# Patient Record
Sex: Female | Born: 1940 | Race: White | Hispanic: No
Health system: Southern US, Community
[De-identification: ages and names within clinical notes are randomized; demographics above are authoritative.]

## PROBLEM LIST (undated history)

## (undated) DIAGNOSIS — Z8719 Personal history of other diseases of the digestive system: Secondary | ICD-10-CM

## (undated) DIAGNOSIS — M1990 Unspecified osteoarthritis, unspecified site: Secondary | ICD-10-CM

## (undated) DIAGNOSIS — K219 Gastro-esophageal reflux disease without esophagitis: Secondary | ICD-10-CM

## (undated) DIAGNOSIS — Q394 Esophageal web: Secondary | ICD-10-CM

## (undated) DIAGNOSIS — R32 Unspecified urinary incontinence: Secondary | ICD-10-CM

## (undated) HISTORY — PX: TUBAL LIGATION: SHX77

## (undated) HISTORY — PX: CHOLECYSTECTOMY: SHX55

## (undated) HISTORY — PX: DILATION AND CURETTAGE OF UTERUS: SHX78

## (undated) HISTORY — PX: APPENDECTOMY: SHX54

---
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
## 1963-11-08 DIAGNOSIS — Q394 Esophageal web: Secondary | ICD-10-CM

## 1963-11-08 HISTORY — DX: Esophageal web: Q39.4

## 2000-02-01 ENCOUNTER — Encounter: Admission: RE | Admit: 2000-02-01 | Discharge: 2000-02-01 | Payer: Self-pay | Admitting: Obstetrics and Gynecology

## 2000-02-01 ENCOUNTER — Encounter: Payer: Self-pay | Admitting: Obstetrics and Gynecology

## 2001-02-13 ENCOUNTER — Encounter: Payer: Self-pay | Admitting: Obstetrics and Gynecology

## 2001-02-13 ENCOUNTER — Encounter: Admission: RE | Admit: 2001-02-13 | Discharge: 2001-02-13 | Payer: Self-pay | Admitting: Obstetrics and Gynecology

## 2002-02-18 ENCOUNTER — Encounter: Payer: Self-pay | Admitting: Obstetrics and Gynecology

## 2002-02-18 ENCOUNTER — Encounter: Admission: RE | Admit: 2002-02-18 | Discharge: 2002-02-18 | Payer: Self-pay | Admitting: Obstetrics and Gynecology

## 2003-03-06 ENCOUNTER — Encounter: Payer: Self-pay | Admitting: Obstetrics and Gynecology

## 2003-03-06 ENCOUNTER — Encounter: Admission: RE | Admit: 2003-03-06 | Discharge: 2003-03-06 | Payer: Self-pay | Admitting: Obstetrics and Gynecology

## 2004-03-08 ENCOUNTER — Encounter: Admission: RE | Admit: 2004-03-08 | Discharge: 2004-03-08 | Payer: Self-pay | Admitting: Obstetrics and Gynecology

## 2005-03-09 ENCOUNTER — Encounter: Admission: RE | Admit: 2005-03-09 | Discharge: 2005-03-09 | Payer: Self-pay | Admitting: Obstetrics and Gynecology

## 2005-03-28 ENCOUNTER — Encounter: Admission: RE | Admit: 2005-03-28 | Discharge: 2005-03-28 | Payer: Self-pay | Admitting: Obstetrics and Gynecology

## 2006-03-29 ENCOUNTER — Encounter: Admission: RE | Admit: 2006-03-29 | Discharge: 2006-03-29 | Payer: Self-pay | Admitting: Obstetrics and Gynecology

## 2007-04-03 ENCOUNTER — Encounter: Admission: RE | Admit: 2007-04-03 | Discharge: 2007-04-03 | Payer: Self-pay | Admitting: Obstetrics and Gynecology

## 2008-04-03 ENCOUNTER — Encounter: Admission: RE | Admit: 2008-04-03 | Discharge: 2008-04-03 | Payer: Self-pay | Admitting: Obstetrics and Gynecology

## 2009-04-07 ENCOUNTER — Encounter: Admission: RE | Admit: 2009-04-07 | Discharge: 2009-04-07 | Payer: Self-pay | Admitting: Obstetrics and Gynecology

## 2010-03-04 ENCOUNTER — Ambulatory Visit (HOSPITAL_COMMUNITY): Admission: RE | Admit: 2010-03-04 | Discharge: 2010-03-04 | Payer: Self-pay | Admitting: Obstetrics and Gynecology

## 2010-04-09 ENCOUNTER — Encounter: Admission: RE | Admit: 2010-04-09 | Discharge: 2010-04-09 | Payer: Self-pay | Admitting: Obstetrics and Gynecology

## 2011-03-31 ENCOUNTER — Other Ambulatory Visit: Payer: Self-pay | Admitting: Family Medicine

## 2011-03-31 DIAGNOSIS — Z1231 Encounter for screening mammogram for malignant neoplasm of breast: Secondary | ICD-10-CM

## 2011-04-12 ENCOUNTER — Ambulatory Visit
Admission: RE | Admit: 2011-04-12 | Discharge: 2011-04-12 | Disposition: A | Discharge status: Home / Self Care | Payer: Medicare Other | Source: Ambulatory Visit | Attending: Family Medicine | Admitting: Family Medicine

## 2011-04-12 DIAGNOSIS — Z1231 Encounter for screening mammogram for malignant neoplasm of breast: Secondary | ICD-10-CM

## 2011-11-08 HISTORY — PX: ANTERIOR AND POSTERIOR REPAIR: SHX1172

## 2011-11-08 HISTORY — PX: EXCISIONAL HEMORRHOIDECTOMY: SHX1541

## 2011-11-08 HISTORY — PX: OTHER SURGICAL HISTORY: SHX169

## 2012-03-09 ENCOUNTER — Other Ambulatory Visit: Payer: Self-pay | Admitting: Family Medicine

## 2012-03-09 DIAGNOSIS — Z1231 Encounter for screening mammogram for malignant neoplasm of breast: Secondary | ICD-10-CM

## 2012-04-24 ENCOUNTER — Ambulatory Visit
Admission: RE | Admit: 2012-04-24 | Discharge: 2012-04-24 | Disposition: A | Discharge status: Home / Self Care | Payer: Medicare Other | Source: Ambulatory Visit | Attending: Family Medicine | Admitting: Family Medicine

## 2012-04-24 DIAGNOSIS — Z1231 Encounter for screening mammogram for malignant neoplasm of breast: Secondary | ICD-10-CM

## 2013-03-29 ENCOUNTER — Other Ambulatory Visit (HOSPITAL_COMMUNITY): Payer: Self-pay | Admitting: General Practice

## 2013-03-29 ENCOUNTER — Other Ambulatory Visit: Payer: Self-pay | Admitting: General Practice

## 2013-03-29 DIAGNOSIS — Z1231 Encounter for screening mammogram for malignant neoplasm of breast: Secondary | ICD-10-CM

## 2013-03-29 DIAGNOSIS — M818 Other osteoporosis without current pathological fracture: Secondary | ICD-10-CM

## 2013-04-18 ENCOUNTER — Ambulatory Visit (HOSPITAL_COMMUNITY): Payer: Medicare Other

## 2013-04-18 ENCOUNTER — Other Ambulatory Visit (HOSPITAL_COMMUNITY): Payer: Medicare Other

## 2013-04-29 ENCOUNTER — Other Ambulatory Visit: Payer: Medicare Other

## 2013-04-29 ENCOUNTER — Ambulatory Visit: Payer: Medicare Other

## 2013-05-13 ENCOUNTER — Ambulatory Visit
Admission: RE | Admit: 2013-05-13 | Discharge: 2013-05-13 | Disposition: A | Discharge status: Home / Self Care | Payer: Medicare Other | Source: Ambulatory Visit | Attending: General Practice | Admitting: General Practice

## 2013-05-13 DIAGNOSIS — Z1231 Encounter for screening mammogram for malignant neoplasm of breast: Secondary | ICD-10-CM

## 2013-05-13 DIAGNOSIS — M818 Other osteoporosis without current pathological fracture: Secondary | ICD-10-CM

## 2014-04-10 ENCOUNTER — Other Ambulatory Visit: Payer: Self-pay | Admitting: Podiatry

## 2014-04-17 NOTE — Addendum Note (Signed)
Addended by: Wentworth Edelen on: 04/17/2014 10:48 AM   Modules accepted: Orders  

## 2014-04-21 ENCOUNTER — Encounter (HOSPITAL_COMMUNITY): Payer: Self-pay

## 2014-04-22 NOTE — Patient Instructions (Signed)
Mercedes MassedBenton C Schultz  04/22/2014   Your procedure is scheduled on:  05/01/14  [facility].  Call this number if you have problems the morning of surgery: [phone]   Remember:   Do not eat food or drink liquids after midnight.   Take these medicines the morning of surgery with A SIP OF WATER: Omeprazole. You may take your Cyclobenazeprine if needed.   Do not wear jewelry, make-up or nail polish.  Do not wear lotions, powders, or perfumes. You may wear deodorant.  Do not shave 48 hours prior to surgery. Men may shave face and neck.  Do not bring valuables to the hospital.  [facility] is not responsible for any belongings or valuables.               Contacts, dentures or bridgework may not be worn into surgery.  Leave suitcase in the car. After surgery it may be brought to your room.  For patients admitted to the hospital, discharge time is determined by your treatment team.               Patients discharged the day of surgery will not be allowed to drive home.   Special Instructions: Shower using CHG 1 nights before surgery and the morning of surgery.    Please read over the following fact sheets that you were given: Anesthesia Post-op Instructions    Hammer Toes Hammer toes is a condition in which one or more of your toes is permanently flexed. CAUSES  This happens when a muscle imbalance or abnormal bone length makes your small toes buckle. This causes the toe joint to contract and the strong cord-like bands that attach muscles to the bones (tendons) in your toes to shorten.  SIGNS AND SYMPTOMS  Common symptoms of flexible hammer toes include:   A build up of skin cells (Corns). Corns occur where boney bumps come in frequent contact with hard surfaces. For example, where your shoes press and rub.  Irritation.  Inflammation.  Pain.  Limited motion in your toes DIAGNOSIS  Hammer toes are diagnosed through a physical exam of your toes.During the exam,  your health care provider may try to reproduce your symptoms by manipulating your foot. Often, x-ray exams are done to determine the degree of deformity and to make sure that the cause is not a fracture.  TREATMENT  Hammer toes can be treated with corrective surgery. There are several types of surgical procedures that can treat hammer toes. The most common procedures include:  Arthroplasty A portion of the joint is surgically removed and your toe is straightened. The gap fills in with fibrous tissue. This procedure helps treat pain and deformity and helps restore function.  Fusion Cartilage between the two bones of the affected joint is taken out and the bones fuse together into one longer bone. This helps keep your toe stable and reduces pain but leaves your toe stiff, yet straight.  Implantation A portion of your bone is removed and replaced with an implant to restore motion.  Flexor tendon transfers This procedure repositions the tendons that curl the toes down (flexor tendons). This may be done to release the deforming force that causes your toe to buckle. Several of these procedures require fixing your toe with a pin that is visible at the tip of your toe. The pin keeps the toe straight during healing. Your health care provider will remove the pin usually within 4 8 weeks after the procedure.  Document Released: 10/21/2000 Document Revised: 08/14/2013 Document Reviewed: 07/01/2013 [facility] Patient Information 2014 [place] [place].    PATIENT INSTRUCTIONS POST-ANESTHESIA  IMMEDIATELY FOLLOWING SURGERY:  Do not drive or operate machinery for the first twenty four hours after surgery.  Do not make any important decisions for twenty four hours after surgery or while taking narcotic pain medications or sedatives.  If you develop intractable nausea and vomiting or a severe headache please notify your doctor immediately.  FOLLOW-UP:  Please make an appointment with your surgeon as instructed.  You do not need to follow up with anesthesia unless specifically instructed to do so.  WOUND CARE INSTRUCTIONS (if applicable):  Keep a dry clean dressing on the anesthesia/puncture wound site if there is drainage.  Once the wound has quit draining you may leave it open to air.  Generally you should leave the bandage intact for twenty four hours unless there is drainage.  If the epidural site drains for more than 36-48 hours please call the anesthesia department.  QUESTIONS?:  Please feel free to call your physician or the hospital operator if you have any questions, and they will be happy to assist you.

## 2014-04-23 ENCOUNTER — Encounter (HOSPITAL_COMMUNITY): Payer: Self-pay

## 2014-04-23 ENCOUNTER — Other Ambulatory Visit: Payer: Self-pay

## 2014-04-23 ENCOUNTER — Encounter (HOSPITAL_COMMUNITY)
Admission: RE | Admit: 2014-04-23 | Discharge: 2014-04-23 | Disposition: A | Discharge status: Home / Self Care | Payer: Medicare Other | Source: Ambulatory Visit | Attending: Podiatry | Admitting: Podiatry

## 2014-04-23 DIAGNOSIS — Z01810 Encounter for preprocedural cardiovascular examination: Secondary | ICD-10-CM | POA: Insufficient documentation

## 2014-04-23 DIAGNOSIS — Z01812 Encounter for preprocedural laboratory examination: Secondary | ICD-10-CM | POA: Diagnosis not present

## 2014-04-23 HISTORY — DX: Unspecified osteoarthritis, unspecified site: M19.90

## 2014-04-23 HISTORY — DX: Personal history of other diseases of the digestive system: Z87.19

## 2014-04-23 HISTORY — DX: Gastro-esophageal reflux disease without esophagitis: K21.9

## 2014-04-23 HISTORY — DX: Esophageal web: Q39.4

## 2014-04-23 HISTORY — DX: Unspecified urinary incontinence: R32

## 2014-04-23 LAB — BASIC METABOLIC PANEL
BUN: 21 mg/dL (ref 6–23)
CHLORIDE: 104 meq/L (ref 96–112)
CO2: 26 mEq/L (ref 19–32)
Calcium: 9.3 mg/dL (ref 8.4–10.5)
Creatinine, Ser: 0.79 mg/dL (ref 0.50–1.10)
GFR, EST NON AFRICAN AMERICAN: 82 mL/min — AB (ref >90)
Glucose, Bld: 128 mg/dL — ABNORMAL HIGH (ref 70–99)
Potassium: 4.4 mEq/L (ref 3.7–5.3)
SODIUM: 143 meq/L (ref 137–147)

## 2014-04-23 LAB — HEMOGLOBIN AND HEMATOCRIT, BLOOD
HCT: 40.3 % (ref 36.0–46.0)
Hemoglobin: 13.3 g/dL (ref 12.0–15.0)

## 2014-05-01 ENCOUNTER — Ambulatory Visit (HOSPITAL_COMMUNITY): Payer: Medicare Other | Admitting: Anesthesiology

## 2014-05-01 ENCOUNTER — Ambulatory Visit (HOSPITAL_COMMUNITY): Payer: Medicare Other

## 2014-05-01 ENCOUNTER — Ambulatory Visit (HOSPITAL_COMMUNITY)
Admission: RE | Admit: 2014-05-01 | Discharge: 2014-05-01 | Disposition: A | Discharge status: Home / Self Care | Payer: Medicare Other | Source: Ambulatory Visit | Attending: Podiatry | Admitting: Podiatry

## 2014-05-01 ENCOUNTER — Encounter (HOSPITAL_COMMUNITY): Payer: Self-pay | Admitting: *Deleted

## 2014-05-01 ENCOUNTER — Encounter (HOSPITAL_COMMUNITY): Payer: Medicare Other | Admitting: Anesthesiology

## 2014-05-01 ENCOUNTER — Encounter (HOSPITAL_COMMUNITY)
Admission: RE | Disposition: A | Discharge status: Home / Self Care | Payer: Self-pay | Source: Ambulatory Visit | Attending: Podiatry

## 2014-05-01 DIAGNOSIS — M2040 Other hammer toe(s) (acquired), unspecified foot: Secondary | ICD-10-CM | POA: Insufficient documentation

## 2014-05-01 DIAGNOSIS — S93129A Dislocation of metatarsophalangeal joint of unspecified toe(s), initial encounter: Secondary | ICD-10-CM | POA: Insufficient documentation

## 2014-05-01 DIAGNOSIS — K219 Gastro-esophageal reflux disease without esophagitis: Secondary | ICD-10-CM | POA: Insufficient documentation

## 2014-05-01 DIAGNOSIS — M2041 Other hammer toe(s) (acquired), right foot: Secondary | ICD-10-CM

## 2014-05-01 DIAGNOSIS — Z79899 Other long term (current) drug therapy: Secondary | ICD-10-CM | POA: Insufficient documentation

## 2014-05-01 HISTORY — PX: TENDON TRANSFER: SHX6109

## 2014-05-01 HISTORY — PX: HAMMER TOE SURGERY: SHX385

## 2014-05-01 HISTORY — PX: OSTECTOMY: SHX6439

## 2014-05-01 SURGERY — CORRECTION, HAMMER TOE
Anesthesia: Monitor Anesthesia Care | Site: Foot | Laterality: Right

## 2014-05-01 MED ORDER — PROPOFOL 10 MG/ML IV EMUL
INTRAVENOUS | Status: AC
  Filled 2014-05-01: qty 20

## 2014-05-01 MED ORDER — FENTANYL CITRATE 0.05 MG/ML IJ SOLN
INTRAMUSCULAR | Status: AC
  Filled 2014-05-01: qty 2

## 2014-05-01 MED ORDER — LIDOCAINE HCL (PF) 1 % IJ SOLN
INTRAMUSCULAR | Status: AC
  Filled 2014-05-01: qty 30

## 2014-05-01 MED ORDER — MIDAZOLAM HCL 2 MG/2ML IJ SOLN
INTRAMUSCULAR | Status: AC
  Filled 2014-05-01: qty 2

## 2014-05-01 MED ORDER — FENTANYL CITRATE 0.05 MG/ML IJ SOLN
25.0000 ug | INTRAMUSCULAR | Status: AC
  Administered 2014-05-01 (×2): 25 ug via INTRAVENOUS

## 2014-05-01 MED ORDER — BUPIVACAINE HCL (PF) 0.5 % IJ SOLN
INTRAMUSCULAR | Status: AC
  Filled 2014-05-01: qty 30

## 2014-05-01 MED ORDER — FENTANYL CITRATE 0.05 MG/ML IJ SOLN
INTRAMUSCULAR | Status: DC | PRN
  Administered 2014-05-01 (×2): 25 ug via INTRAVENOUS

## 2014-05-01 MED ORDER — ONDANSETRON HCL 4 MG/2ML IJ SOLN
4.0000 mg | Freq: Once | INTRAMUSCULAR | Status: AC
  Administered 2014-05-01: 4 mg via INTRAVENOUS

## 2014-05-01 MED ORDER — MIDAZOLAM HCL 2 MG/2ML IJ SOLN
1.0000 mg | INTRAMUSCULAR | Status: DC | PRN
  Administered 2014-05-01: 2 mg via INTRAVENOUS

## 2014-05-01 MED ORDER — PROPOFOL INFUSION 10 MG/ML OPTIME
INTRAVENOUS | Status: DC | PRN
  Administered 2014-05-01: 11:00:00 via INTRAVENOUS
  Administered 2014-05-01: 25 ug/kg/min via INTRAVENOUS

## 2014-05-01 MED ORDER — FENTANYL CITRATE 0.05 MG/ML IJ SOLN: 25.0000 ug | INTRAMUSCULAR | Status: DC | PRN

## 2014-05-01 MED ORDER — ONDANSETRON HCL 4 MG/2ML IJ SOLN
INTRAMUSCULAR | Status: AC
  Filled 2014-05-01: qty 2

## 2014-05-01 MED ORDER — LACTATED RINGERS IV SOLN
INTRAVENOUS | Status: DC
  Administered 2014-05-01: 09:00:00 via INTRAVENOUS

## 2014-05-01 MED ORDER — BUPIVACAINE HCL (PF) 0.5 % IJ SOLN
INTRAMUSCULAR | Status: DC | PRN
  Administered 2014-05-01: 20 mL

## 2014-05-01 MED ORDER — MIDAZOLAM HCL 2 MG/2ML IJ SOLN
INTRAMUSCULAR | Status: AC
Start: 2014-05-01 — End: 2014-05-01
  Filled 2014-05-01: qty 2

## 2014-05-01 MED ORDER — CEFAZOLIN SODIUM-DEXTROSE 2-3 GM-% IV SOLR
2.0000 g | INTRAVENOUS | Status: AC
  Administered 2014-05-01: 2 g via INTRAVENOUS
  Filled 2014-05-01: qty 50

## 2014-05-01 MED ORDER — SODIUM CHLORIDE 0.9 % IR SOLN
Status: DC | PRN
  Administered 2014-05-01: 1000 mL

## 2014-05-01 MED ORDER — ONDANSETRON HCL 4 MG/2ML IJ SOLN: 4.0000 mg | Freq: Once | INTRAMUSCULAR | Status: DC | PRN

## 2014-05-01 MED ORDER — MIDAZOLAM HCL 5 MG/5ML IJ SOLN
INTRAMUSCULAR | Status: DC | PRN
  Administered 2014-05-01: 2 mg via INTRAVENOUS

## 2014-05-01 SURGICAL SUPPLY — 69 items
APL SKNCLS STERI-STRIP NONHPOA (GAUZE/BANDAGES/DRESSINGS) ×1
BAG HAMPER (MISCELLANEOUS) ×3 IMPLANT
BANDAGE ELASTIC 4 VELCRO NS (GAUZE/BANDAGES/DRESSINGS) ×3 IMPLANT
BANDAGE ESMARK 4X12 BL STRL LF (DISPOSABLE) ×1 IMPLANT
BANDAGE GAUZE ELAST BULKY 4 IN (GAUZE/BANDAGES/DRESSINGS) ×2 IMPLANT
BENZOIN TINCTURE PRP APPL 2/3 (GAUZE/BANDAGES/DRESSINGS) ×3 IMPLANT
BLADE 15 SAFETY STRL DISP (BLADE) ×4 IMPLANT
BLADE AVERAGE 25MMX9MM (BLADE)
BLADE AVERAGE 25X9 (BLADE) ×1 IMPLANT
BLADE OSC/SAG 11.5X5.5X.38 (BLADE) ×4 IMPLANT
BLADE OSC/SAG 18.5X9 THN (BLADE) ×3 IMPLANT
BLADE OSC/SAGITTAL MD 5.5X18 (BLADE) ×1 IMPLANT
BNDG CMPR 12X4 ELC STRL LF (DISPOSABLE) ×1
BNDG CONFORM 2 STRL LF (GAUZE/BANDAGES/DRESSINGS) ×1 IMPLANT
BNDG ESMARK 4X12 BLUE STRL LF (DISPOSABLE) ×3
BNDG GAUZE ELAST 4 BULKY (GAUZE/BANDAGES/DRESSINGS) ×1 IMPLANT
CHLORAPREP W/TINT 26ML (MISCELLANEOUS) ×3 IMPLANT
CLOSURE WOUND 1/2 X4 (GAUZE/BANDAGES/DRESSINGS) ×1
CLOTH BEACON ORANGE TIMEOUT ST (SAFETY) ×3 IMPLANT
COVER LIGHT HANDLE STERIS (MISCELLANEOUS) ×6 IMPLANT
CUFF TOURNIQUET SINGLE 18IN (TOURNIQUET CUFF) ×3 IMPLANT
DECANTER SPIKE VIAL GLASS SM (MISCELLANEOUS) ×3 IMPLANT
DIGIFUSE TOE 2.0MM 10DEG SHORT (Toe) ×2 IMPLANT
DRAPE OEC MINIVIEW 54X84 (DRAPES) ×3 IMPLANT
DRSG ADAPTIC 3X8 NADH LF (GAUZE/BANDAGES/DRESSINGS) ×3 IMPLANT
DURA STEPPER LG (CAST SUPPLIES) IMPLANT
DURA STEPPER MED (CAST SUPPLIES) IMPLANT
DURA STEPPER SML (CAST SUPPLIES) IMPLANT
DURA STEPPER XL (SOFTGOODS) IMPLANT
ELECT REM PT RETURN 9FT ADLT (ELECTROSURGICAL) ×3
ELECTRODE REM PT RTRN 9FT ADLT (ELECTROSURGICAL) ×1 IMPLANT
GAUZE SPONGE 4X4 12PLY STRL (GAUZE/BANDAGES/DRESSINGS) ×2 IMPLANT
GLOVE BIO SURGEON STRL SZ7.5 (GLOVE) ×3 IMPLANT
GLOVE BIOGEL PI IND STRL 7.0 (GLOVE) IMPLANT
GLOVE BIOGEL PI IND STRL 8.5 (GLOVE) IMPLANT
GLOVE BIOGEL PI INDICATOR 7.0 (GLOVE) ×4
GLOVE BIOGEL PI INDICATOR 8.5 (GLOVE) ×2
GLOVE ECLIPSE 7.0 STRL STRAW (GLOVE) ×2 IMPLANT
GLOVE ECLIPSE 8.5 STRL (GLOVE) ×2 IMPLANT
GLOVE SS BIOGEL STRL SZ 6.5 (GLOVE) IMPLANT
GLOVE SUPERSENSE BIOGEL SZ 6.5 (GLOVE) ×2
GOWN STRL REUS W/TWL LRG LVL3 (GOWN DISPOSABLE) ×9 IMPLANT
GUIDEWIRE DIGIFUSE .80X70MM (WIRE) ×2 IMPLANT
K-WIRE 0.8X100 (WIRE) ×2 IMPLANT
K-WIRE 6 (WIRE)
KIT ROOM TURNOVER AP CYSTO (KITS) ×3 IMPLANT
KIT ROOM TURNOVER APOR (KITS) ×3 IMPLANT
KWIRE 6 (WIRE) IMPLANT
MANIFOLD NEPTUNE II (INSTRUMENTS) ×3 IMPLANT
NDL HYPO 27GX1-1/4 (NEEDLE) ×3 IMPLANT
NEEDLE HYPO 27GX1-1/4 (NEEDLE) ×6 IMPLANT
NS IRRIG 1000ML POUR BTL (IV SOLUTION) ×3 IMPLANT
PACK BASIC LIMB (CUSTOM PROCEDURE TRAY) ×3 IMPLANT
PAD ARMBOARD 7.5X6 YLW CONV (MISCELLANEOUS) ×3 IMPLANT
PIN CAPS ORTHO GREEN .062 (PIN) IMPLANT
RASP SM TEAR CROSS CUT (RASP) ×2 IMPLANT
SCREW CANNULATED 2.0X11MM (Screw) ×4 IMPLANT
SCREW CANNULATED 3.0X12MM (Screw) ×2 IMPLANT
SET BASIN LINEN APH (SET/KITS/TRAYS/PACK) ×3 IMPLANT
SPONGE GAUZE 4X4 12PLY (GAUZE/BANDAGES/DRESSINGS) ×2 IMPLANT
SPONGE LAP 18X18 X RAY DECT (DISPOSABLE) ×3 IMPLANT
STRIP CLOSURE SKIN 1/2X4 (GAUZE/BANDAGES/DRESSINGS) ×3 IMPLANT
SUT ETHIBOND CT1 BRD 2-0 30IN (SUTURE) ×2 IMPLANT
SUT PROLENE 4 0 PS 2 18 (SUTURE) ×1 IMPLANT
SUT VIC AB 2-0 CT2 27 (SUTURE) ×1 IMPLANT
SUT VIC AB 4-0 PS2 27 (SUTURE) ×3 IMPLANT
SUT VICRYL AB 3-0 FS1 BRD 27IN (SUTURE) ×1 IMPLANT
SYR CONTROL 10ML LL (SYRINGE) ×7 IMPLANT
TOWEL OR 17X26 4PK STRL BLUE (TOWEL DISPOSABLE) ×3 IMPLANT

## 2014-05-01 NOTE — Anesthesia Preprocedure Evaluation (Signed)
Anesthesia Evaluation  Patient identified by MRN, date of birth, ID band Patient awake    Reviewed: Allergy & Precautions, H&P , NPO status , Patient's Chart, lab work & pertinent test results  Airway Mallampati: I TM Distance: >3 FB     Dental  (+) Teeth Intact   Pulmonary neg pulmonary ROS,  breath sounds clear to auscultation        Cardiovascular negative cardio ROS  Rhythm:Regular Rate:Normal     Neuro/Psych    GI/Hepatic hiatal hernia, GERD-  Medicated and Controlled,  Endo/Other    Renal/GU      Musculoskeletal   Abdominal   Peds  Hematology   Anesthesia Other Findings   Reproductive/Obstetrics                           Anesthesia Physical Anesthesia Plan  ASA: II  Anesthesia Plan: MAC   Post-op Pain Management:    Induction: Intravenous  Airway Management Planned: Nasal Cannula  Additional Equipment:   Intra-op Plan:   Post-operative Plan:   Informed Consent: I have reviewed the patients History and Physical, chart, labs and discussed the procedure including the risks, benefits and alternatives for the proposed anesthesia with the patient or authorized representative who has indicated his/her understanding and acceptance.     Plan Discussed with:   Anesthesia Plan Comments:         Anesthesia Quick Evaluation

## 2014-05-01 NOTE — Op Note (Signed)
OPERATIVE NOTE  DATE OF PROCEDURE:  05/01/2014  SURGEON:   Dallas SchimkeBenjamin Ivan Rossie Bretado, DPM  OR STAFF:   Circulator: Horton MarshallKatie S Woods, RN Relief Circulator: Cyndie Chimeanya Jarrell Smith, RN Scrub Person: Hurshel PartyAngela Maria Witt, CST Circulator Assistant: Cyndie Chimeanya Jarrell Smith, RN RN First Assistant: Eliane Decreeatherine Jann Page, RN   PREOPERATIVE DIAGNOSIS:   1.  Dislocated 2nd metatarsophalangeal joint (closed), right foot (ICD-9 838.05) 2.  Hammertoe deformity 2nd digit, right foot (ICD-9 735.4) 3.  Pain, right foot (ICD-9 729.5)  POSTOPERATIVE DIAGNOSIS: Same  PROCEDURE: 1.  Weil osteotomy of the 2nd metatarsal, right foot (CPT [phone]) 2.  Hammertoe repair 2nd digit, right foot (CPT [id number])  ANESTHESIA:  Monitor Anesthesia Care   HEMOSTASIS:   Pneumatic ankle tourniquet set at 250 mmHg  ESTIMATED BLOOD LOSS:   Minimal (<5 cc)  MATERIALS USED:  1.  Stryker screw 3.0 mm 2.  AccuMed toe implant  INJECTABLES: Marcaine 0.5% plain; 20mL  PATHOLOGY:   None  COMPLICATIONS:   None  INDICATIONS:   Painful, chronic hammertoe deformity of the second digit of the right foot with dislocation of the second metatarsophalangeal joint  DESCRIPTION OF THE PROCEDURE:   The patient was brought to the operating room and placed on the operative table in the supine position.  A pneumatic ankle tourniquet was applied to the patient's ankle.  Following sedation, the surgical site was anesthetized with 0.5% Marcaine plain.  The foot was then prepped, scrubbed, and draped in the usual sterile technique.  The foot was elevated, exsanguinated and the pneumatic ankle tourniquet inflated to 250 mmHg.    Attention was directed to the dorsal aspect of the right foot.  A linear longitudinal incision was made extending from the middle phalanx proximally over the second metatarsophalangeal joint.  Dissection was continued deep down to the level of the extensor digitorum longus tendon.  The extensor hood was released.  A Z shaped  tenotomy was performed to allow for appropriate tendon balancing.  The extensor tendon was reflected proximally and distally thus exposing the proximal interphalangeal joint and metatarsophalangeal joint.    A transverse capsulotomy of the second metatarsophalangeal joint was performed.  The proximal phalanx was found to be dorsally dislocated upon the second metatarsal head consistent with preoperative radiographic findings.  A McGlamry elevator was used to release soft tissue contractures at the second metatarsophalangeal joint.  A Weil osteotomy was performed to the second metatarsal bone.  The capital fragment was translated proximally.  A 2.0 mm cannulated Stryker screw was placed across the osteotomy site.  Adequate fixation was not achieved.  The screw was removed.  A longer screw was placed across the osteotomy site.  Adequate fixation was not achieved.  It was determined that a larger screw would be required for adequate fixation.  A 3.0 mm cannulated Stryker screw was placed across the osteotomy site.  Fixation was achieved.  The second metatarsophalangeal joint reduced.  Attention was directed to the proximal interphalangeal joint.  A transverse capsulotomy was performed.  The articular surface from the head of the proximal phalanx and base of the middle phalanx were resected.  The flexor digitorum longus tendon was identified and transected.  The tendon was split and the medial portion of the tendon passed medial to the shaft of the proximal phalanx.  The lateral portion of the tendon was passed lateral to the shaft of the proximal phalanx.  The portions of tendon were transferred to the dorsal aspect of the shaft of the proximal phalanx  and reapproximated with 2-0 Ethibond.  An Acumed toe implant was inserted across the proximal interphalangeal joint.  Position was confirmed with C-arm fluoroscopy and found to be in good alignment.  Alignment of the toe evaluated and found to be in good alignment.   The second metatarsophalangeal joint was found to be reduced.  A dressing was applied to the operative foot.  The pneumatic ankle tourniquet was deflated and a prompt hyperemic response was noted to all digits of the right foot.   The patient tolerated the procedure well.  The patient was then transferred to PACU with vital signs stable and vascular status intact to all toes of the operative foot.  Following a period of postoperative monitoring, the patient will be discharged home.

## 2014-05-01 NOTE — H&P (Signed)
HISTORY AND PHYSICAL INTERVAL NOTE:  05/01/2014  9:11 AM  Mercedes Schultz  has presented today for surgery, with the diagnosis of hammer toe deformity 2nd toe right foot, dislocated MPJ 2nd toe right foot and pain.  The various methods of treatment have been discussed with the patient.  No guarantees were given.  After consideration of risks, benefits and other options for treatment, the patient has consented to surgery.  I have reviewed the patients' chart and labs.    Patient Vitals for the past 24 hrs:  BP Temp Temp src Pulse Resp SpO2  05/01/14 0753 115/74 mmHg 98 F (36.7 C) Oral 68 18 98 %    A history and physical examination was performed in my office.  The patient was reexamined.  There have been no changes to this history and physical examination.  Mercedes Schultz, DPM

## 2014-05-01 NOTE — Addendum Note (Signed)
Addendum created 05/01/14 1428 by Franco Noneseresa S Yates, CRNA   Modules edited: Charges VN

## 2014-05-01 NOTE — Transfer of Care (Signed)
Immediate Anesthesia Transfer of Care Note  Patient: [name]  Procedure(s) Performed: Procedure(s): HAMMER TOE REPAIR 2ND TOE RIGHT FOOT (Right) OSTECTOMY 2ND METATARSAL RIGHT FOOT (Right) POSSIBLE TENDON TRANSFER 2ND TOE RIGHT FOOT (Right)  Patient Location: PACU  Anesthesia Type:MAC  Level of Consciousness: awake, alert , oriented and patient cooperative  Airway & Oxygen Therapy: Patient Spontanous Breathing  Post-op Assessment: Report given to PACU RN, Post -op Vital signs reviewed and stable and Patient moving all extremities  Post vital signs: Reviewed and stable  Complications: No apparent anesthesia complications

## 2014-05-01 NOTE — Anesthesia Postprocedure Evaluation (Signed)
  Anesthesia Post-op Note  Patient: Mercedes Schultz  Procedure(s) Performed: Procedure(s): HAMMER TOE REPAIR 2ND TOE RIGHT FOOT (Right) OSTECTOMY 2ND METATARSAL RIGHT FOOT (Right) POSSIBLE TENDON TRANSFER 2ND TOE RIGHT FOOT (Right)  Patient Location: PACU  Anesthesia Type:MAC  Level of Consciousness: awake, alert , oriented and patient cooperative  Airway and Oxygen Therapy: Patient Spontanous Breathing  Post-op Pain: 3 /10, mild  Post-op Assessment: Post-op Vital signs reviewed, Patient's Cardiovascular Status Stable, Respiratory Function Stable and NAUSEA AND VOMITING PRESENT  Post-op Vital Signs: Reviewed and stable  Last Vitals:  Filed Vitals:   05/01/14 0930  BP: 111/71  Pulse:   Temp:   Resp: 13    Complications: No apparent anesthesia complications

## 2014-05-05 ENCOUNTER — Encounter (HOSPITAL_COMMUNITY): Payer: Self-pay | Admitting: Podiatry

## 2014-05-06 ENCOUNTER — Encounter (HOSPITAL_COMMUNITY): Payer: Self-pay | Admitting: Podiatry

## 2014-06-09 ENCOUNTER — Other Ambulatory Visit: Payer: Self-pay

## 2014-06-09 DIAGNOSIS — Z1231 Encounter for screening mammogram for malignant neoplasm of breast: Secondary | ICD-10-CM

## 2014-07-22 ENCOUNTER — Ambulatory Visit: Payer: Medicare Other

## 2014-08-04 ENCOUNTER — Ambulatory Visit
Admission: RE | Admit: 2014-08-04 | Discharge: 2014-08-04 | Disposition: A | Discharge status: Home / Self Care | Payer: Medicare Other | Source: Ambulatory Visit

## 2014-08-04 DIAGNOSIS — Z1231 Encounter for screening mammogram for malignant neoplasm of breast: Secondary | ICD-10-CM

## 2015-08-03 ENCOUNTER — Other Ambulatory Visit: Payer: Self-pay

## 2015-08-03 DIAGNOSIS — Z1231 Encounter for screening mammogram for malignant neoplasm of breast: Secondary | ICD-10-CM

## 2015-08-10 ENCOUNTER — Ambulatory Visit
Admission: RE | Admit: 2015-08-10 | Discharge: 2015-08-10 | Disposition: A | Discharge status: Home / Self Care | Payer: Medicare HMO | Source: Ambulatory Visit

## 2015-08-10 DIAGNOSIS — Z1231 Encounter for screening mammogram for malignant neoplasm of breast: Secondary | ICD-10-CM

## 2016-07-25 ENCOUNTER — Other Ambulatory Visit: Payer: Self-pay | Admitting: Family

## 2016-07-25 DIAGNOSIS — Z1231 Encounter for screening mammogram for malignant neoplasm of breast: Secondary | ICD-10-CM

## 2016-08-10 ENCOUNTER — Ambulatory Visit
Admission: RE | Admit: 2016-08-10 | Discharge: 2016-08-10 | Disposition: A | Discharge status: Home / Self Care | Payer: Medicare HMO | Source: Ambulatory Visit | Attending: Family | Admitting: Family

## 2016-08-10 DIAGNOSIS — Z1231 Encounter for screening mammogram for malignant neoplasm of breast: Secondary | ICD-10-CM

## 2017-08-03 ENCOUNTER — Other Ambulatory Visit: Payer: Self-pay | Admitting: Family

## 2017-08-03 DIAGNOSIS — R5381 Other malaise: Secondary | ICD-10-CM

## 2017-08-04 ENCOUNTER — Other Ambulatory Visit: Payer: Self-pay | Admitting: Family

## 2017-08-04 DIAGNOSIS — M8580 Other specified disorders of bone density and structure, unspecified site: Secondary | ICD-10-CM

## 2017-08-04 DIAGNOSIS — Z1231 Encounter for screening mammogram for malignant neoplasm of breast: Secondary | ICD-10-CM

## 2017-08-28 ENCOUNTER — Ambulatory Visit
Admission: RE | Admit: 2017-08-28 | Discharge: 2017-08-28 | Disposition: A | Discharge status: Home / Self Care | Payer: Medicare HMO | Source: Ambulatory Visit | Attending: Family | Admitting: Family

## 2017-08-28 DIAGNOSIS — Z1231 Encounter for screening mammogram for malignant neoplasm of breast: Secondary | ICD-10-CM

## 2017-08-28 DIAGNOSIS — M8580 Other specified disorders of bone density and structure, unspecified site: Secondary | ICD-10-CM

## 2018-08-07 ENCOUNTER — Other Ambulatory Visit: Payer: Self-pay | Admitting: Family

## 2018-08-07 DIAGNOSIS — Z1231 Encounter for screening mammogram for malignant neoplasm of breast: Secondary | ICD-10-CM

## 2018-09-11 ENCOUNTER — Ambulatory Visit
Admission: RE | Admit: 2018-09-11 | Discharge: 2018-09-11 | Disposition: A | Discharge status: Home / Self Care | Payer: Medicare Other | Source: Ambulatory Visit | Attending: Family | Admitting: Family

## 2018-09-11 DIAGNOSIS — Z1231 Encounter for screening mammogram for malignant neoplasm of breast: Secondary | ICD-10-CM

## 2019-08-12 ENCOUNTER — Other Ambulatory Visit: Payer: Self-pay | Admitting: Family

## 2019-08-12 DIAGNOSIS — Z1231 Encounter for screening mammogram for malignant neoplasm of breast: Secondary | ICD-10-CM

## 2019-09-26 ENCOUNTER — Ambulatory Visit
Admission: RE | Admit: 2019-09-26 | Discharge: 2019-09-26 | Disposition: A | Discharge status: Home / Self Care | Payer: Medicare Other | Source: Ambulatory Visit | Attending: Family | Admitting: Family

## 2019-09-26 ENCOUNTER — Other Ambulatory Visit: Payer: Self-pay

## 2019-09-26 DIAGNOSIS — Z1231 Encounter for screening mammogram for malignant neoplasm of breast: Secondary | ICD-10-CM

## 2020-06-26 IMAGING — MG DIGITAL SCREENING BILAT W/ TOMO W/ CAD
8 series · 9 of 24 positions shown · non-contrast
Comparison: Previous exam(s).

CLINICAL DATA: Screening.

EXAM:
DIGITAL SCREENING BILATERAL MAMMOGRAM WITH TOMO AND CAD

[L MLO synth-2D]
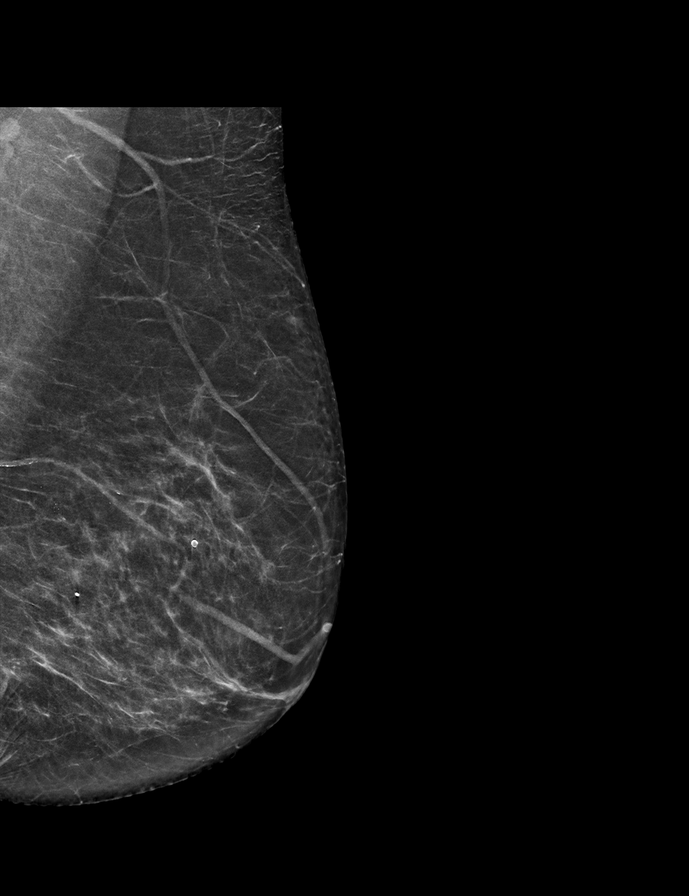

[R MLO synth-2D]
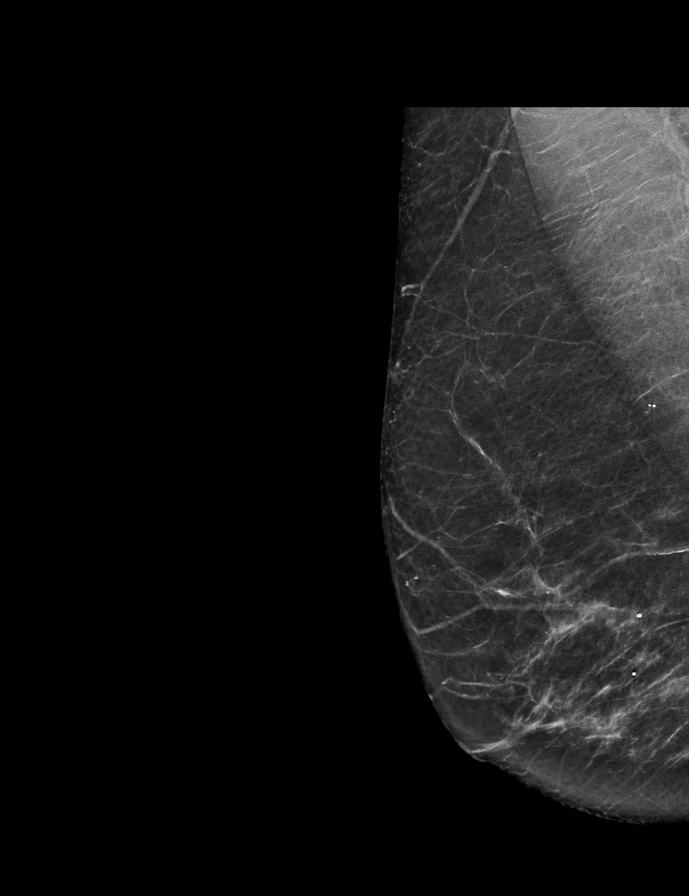

[R CC synth-2D]
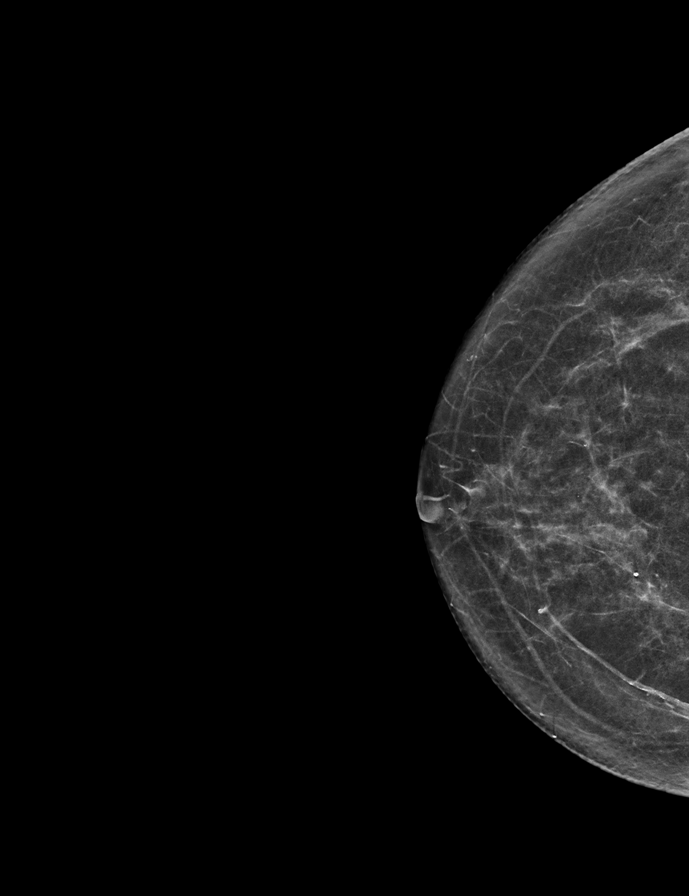

[L CC synth-2D]
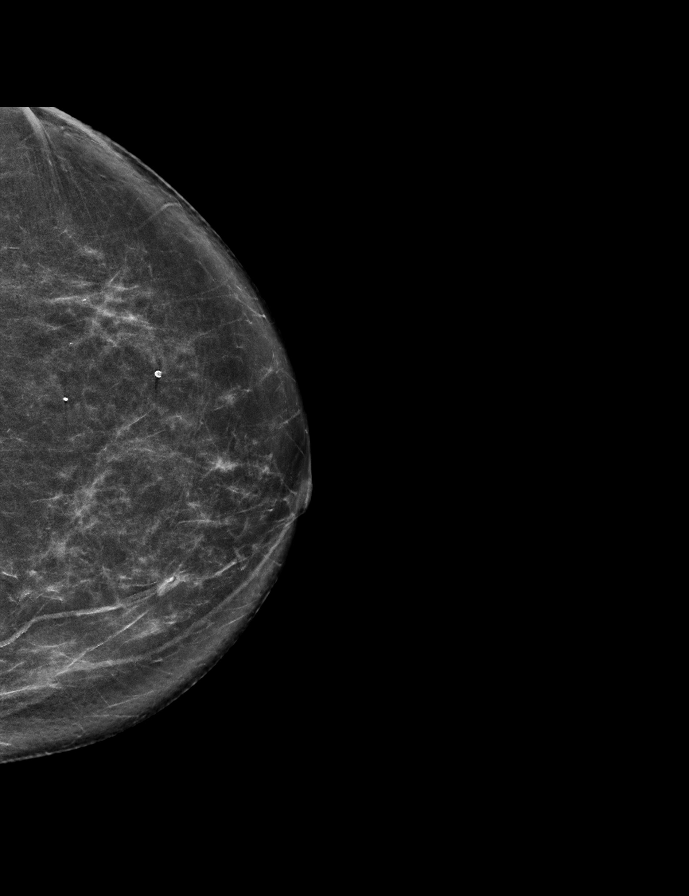

[L CC tomo · 2 of 64 frames shown]
[frame 21/64]
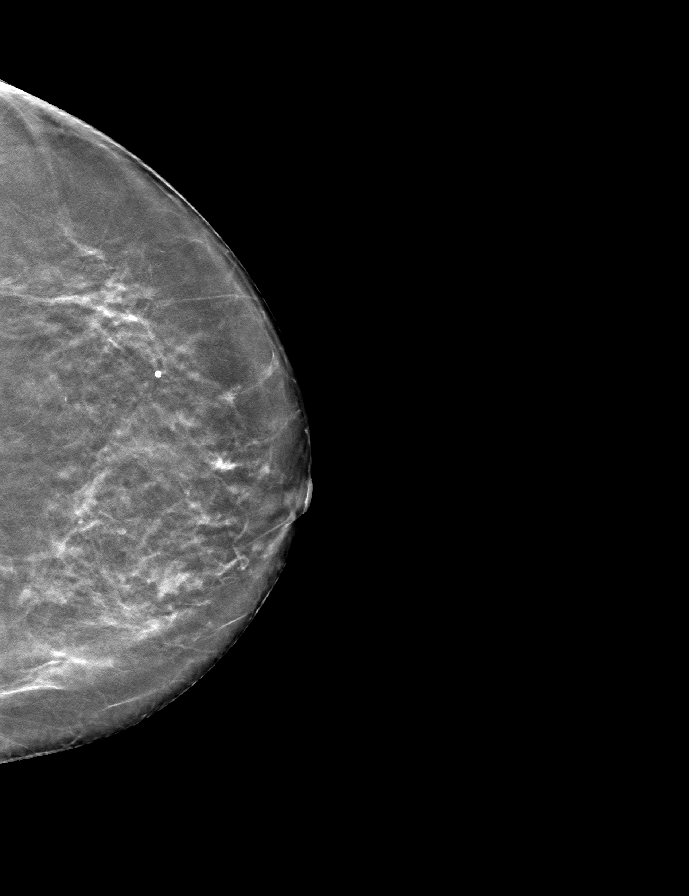
[frame 33/64]
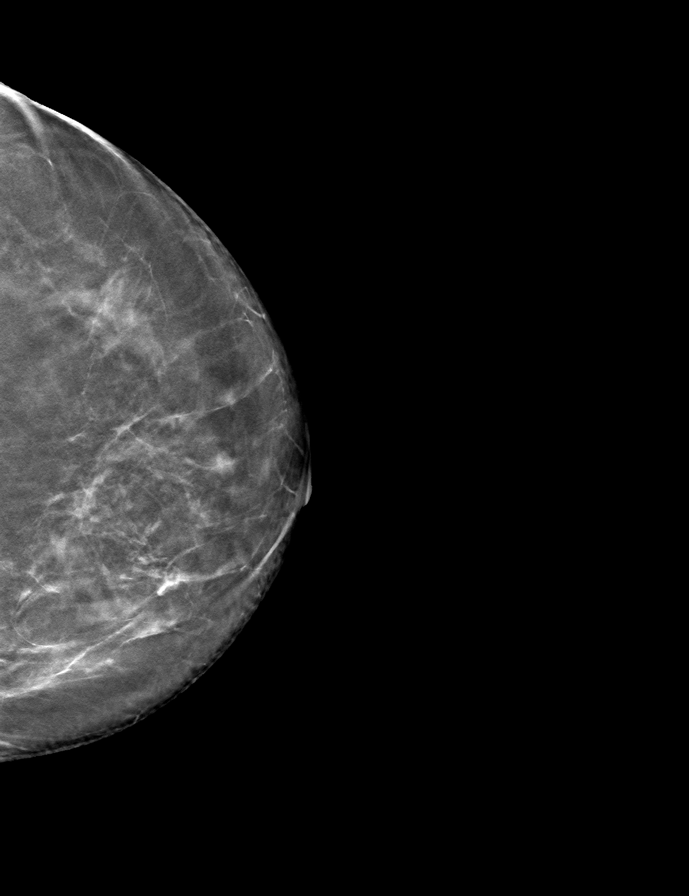

[R MLO tomo · tomo slice 30/59.0]
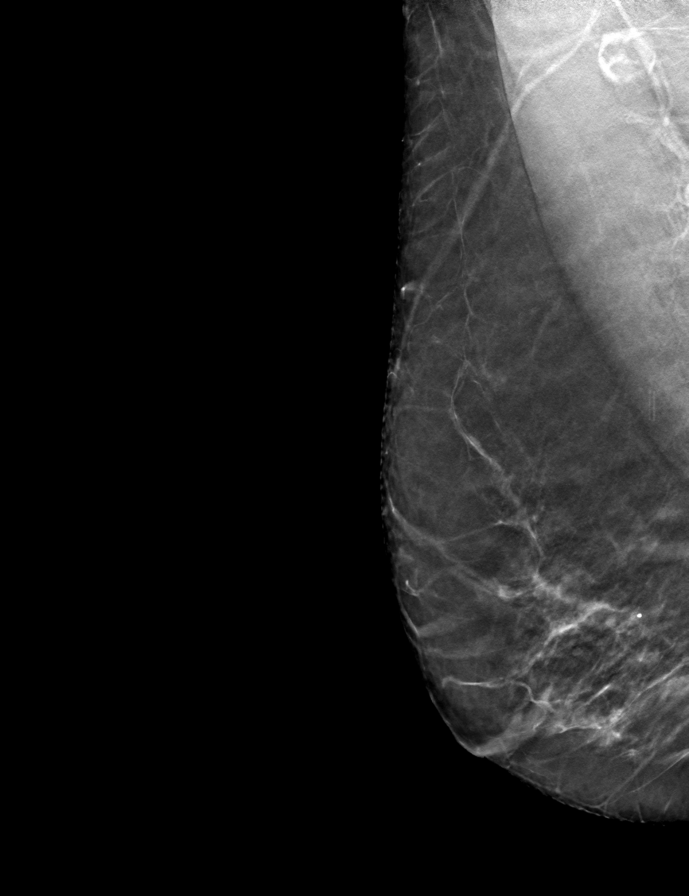

[L MLO tomo · tomo slice 31/60.0]
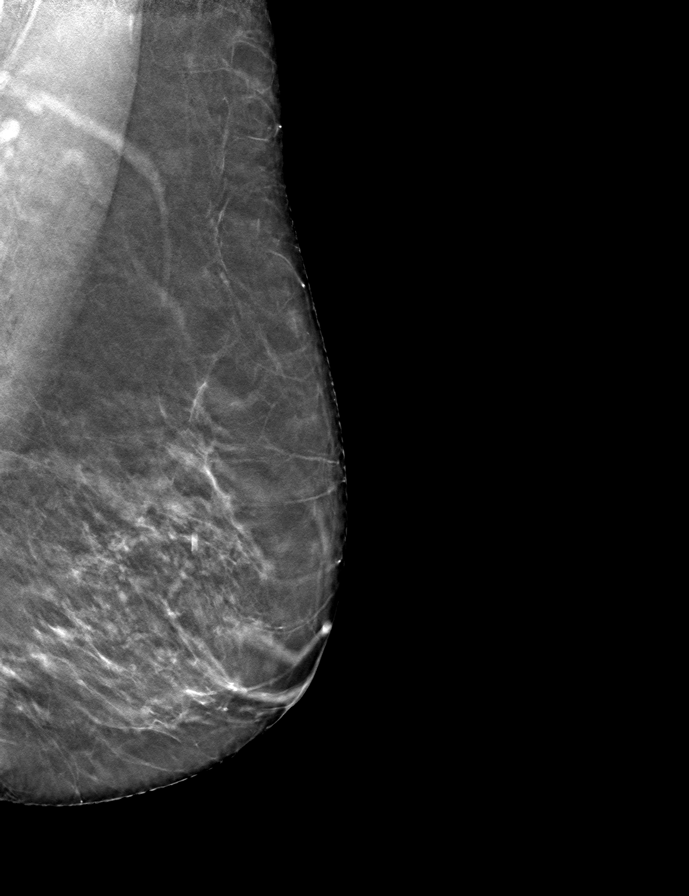

[R CC tomo · tomo slice 29/58.0]
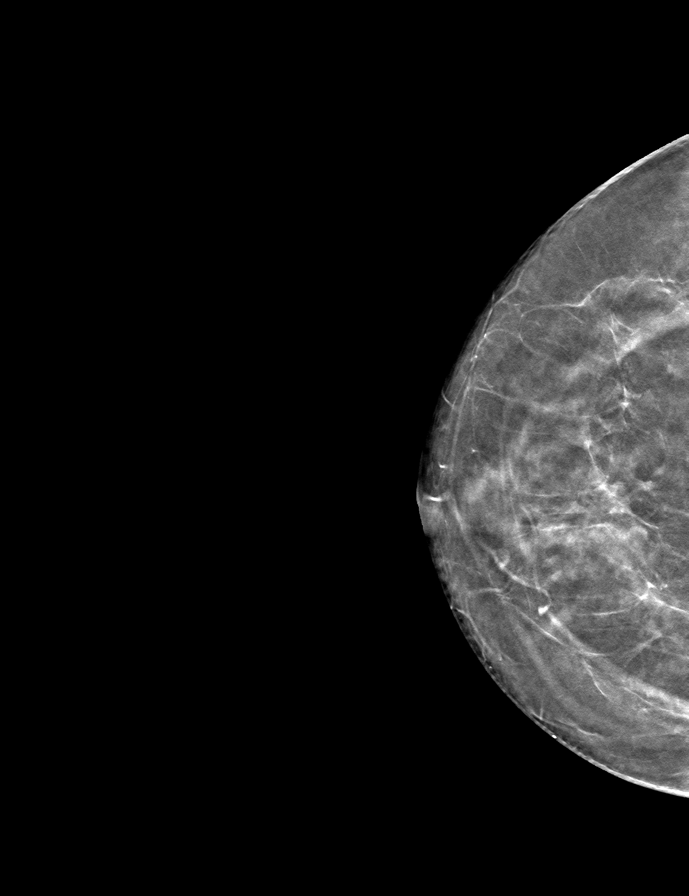

[9 of 24 positions shown; findings below may reference images not displayed]

ACR Breast Density Category b: There are scattered areas of
fibroglandular density.
FINDINGS: There are no findings suspicious for malignancy. Images were
processed with CAD.
IMPRESSION: No mammographic evidence of malignancy. A result letter of this
screening mammogram will be mailed directly to the patient.

RECOMMENDATION:
Screening mammogram in one year. (Code:[id number])

BI-RADS CATEGORY  1: Negative.

## 2020-09-07 ENCOUNTER — Other Ambulatory Visit: Payer: Self-pay | Admitting: Family

## 2020-09-07 DIAGNOSIS — Z780 Asymptomatic menopausal state: Secondary | ICD-10-CM

## 2020-09-07 DIAGNOSIS — Z1231 Encounter for screening mammogram for malignant neoplasm of breast: Secondary | ICD-10-CM

## 2020-12-18 ENCOUNTER — Ambulatory Visit
Admission: RE | Admit: 2020-12-18 | Discharge: 2020-12-18 | Disposition: A | Discharge status: Home / Self Care | Payer: Medicare Other | Source: Ambulatory Visit | Attending: Family | Admitting: Family

## 2020-12-18 ENCOUNTER — Other Ambulatory Visit: Payer: Self-pay

## 2020-12-18 DIAGNOSIS — Z780 Asymptomatic menopausal state: Secondary | ICD-10-CM

## 2020-12-18 DIAGNOSIS — Z1231 Encounter for screening mammogram for malignant neoplasm of breast: Secondary | ICD-10-CM

## 2021-11-11 ENCOUNTER — Other Ambulatory Visit: Payer: Self-pay | Admitting: Emergency Medicine

## 2021-11-11 DIAGNOSIS — Z1231 Encounter for screening mammogram for malignant neoplasm of breast: Secondary | ICD-10-CM

## 2021-12-20 ENCOUNTER — Ambulatory Visit
Admission: RE | Admit: 2021-12-20 | Discharge: 2021-12-20 | Disposition: A | Discharge status: Home / Self Care | Payer: Medicare Other | Source: Ambulatory Visit | Attending: Emergency Medicine | Admitting: Emergency Medicine

## 2021-12-20 DIAGNOSIS — Z1231 Encounter for screening mammogram for malignant neoplasm of breast: Secondary | ICD-10-CM

## 2022-11-18 ENCOUNTER — Encounter: Payer: Self-pay | Admitting: Acute Care

## 2022-11-18 ENCOUNTER — Other Ambulatory Visit: Payer: Self-pay | Admitting: Emergency Medicine

## 2022-11-18 DIAGNOSIS — Z1231 Encounter for screening mammogram for malignant neoplasm of breast: Secondary | ICD-10-CM

## 2022-12-26 ENCOUNTER — Ambulatory Visit
Admission: RE | Admit: 2022-12-26 | Discharge: 2022-12-26 | Disposition: A | Discharge status: Home / Self Care | Payer: Medicare Other | Source: Ambulatory Visit | Attending: Emergency Medicine | Admitting: Emergency Medicine

## 2022-12-26 DIAGNOSIS — Z1231 Encounter for screening mammogram for malignant neoplasm of breast: Secondary | ICD-10-CM

## 2023-11-20 ENCOUNTER — Other Ambulatory Visit: Payer: Self-pay | Admitting: Emergency Medicine

## 2023-11-20 DIAGNOSIS — Z1231 Encounter for screening mammogram for malignant neoplasm of breast: Secondary | ICD-10-CM

## 2023-12-28 ENCOUNTER — Ambulatory Visit: Payer: Medicare Other

## 2024-01-18 ENCOUNTER — Ambulatory Visit
Admission: RE | Admit: 2024-01-18 | Discharge: 2024-01-18 | Disposition: A | Discharge status: Home / Self Care | Payer: Medicare Other | Source: Ambulatory Visit | Attending: Emergency Medicine | Admitting: Emergency Medicine

## 2024-01-18 DIAGNOSIS — Z1231 Encounter for screening mammogram for malignant neoplasm of breast: Secondary | ICD-10-CM

## 2024-11-06 ENCOUNTER — Other Ambulatory Visit: Payer: Self-pay | Admitting: Podiatry

## 2024-11-13 ENCOUNTER — Encounter (HOSPITAL_COMMUNITY)
Admission: RE | Admit: 2024-11-13 | Discharge: 2024-11-13 | Disposition: A | Discharge status: Home / Self Care | Source: Ambulatory Visit | Attending: Podiatry | Admitting: Podiatry

## 2024-11-13 ENCOUNTER — Encounter (HOSPITAL_COMMUNITY): Payer: Self-pay

## 2024-11-13 ENCOUNTER — Other Ambulatory Visit: Payer: Self-pay

## 2024-11-13 DIAGNOSIS — Z01818 Encounter for other preprocedural examination: Secondary | ICD-10-CM | POA: Diagnosis present

## 2024-11-13 DIAGNOSIS — S92335K Nondisplaced fracture of third metatarsal bone, left foot, subsequent encounter for fracture with nonunion: Secondary | ICD-10-CM | POA: Diagnosis not present

## 2024-11-13 DIAGNOSIS — X58XXXD Exposure to other specified factors, subsequent encounter: Secondary | ICD-10-CM | POA: Insufficient documentation

## 2024-11-13 NOTE — Patient Instructions (Signed)
 "      Mercedes Schultz  11/13/2024     @PREFPERIOPPHARMACY @   Your procedure is scheduled on 11/15/2024.   [facility] at  0745 A.M.   Call this number if you have problems the morning of surgery:  [phone]  If you experience any cold or flu symptoms such as cough, fever, chills, shortness of breath, etc. between now and your scheduled surgery, please notify us  at the above number.   Remember:  Do not eat after midnight.   You may drink clear liquids until  0600 am on 11/15/2024.        Clear liquids allowed are:                    Water, Carbonated beverages (diabetics please choose diet or no sugar options), Black Coffee Only (No creamer, milk or cream, including half & half and powdered creamer), and Clear Sports drink (No red color; diabetics please choose diet or no sugar options)    Take these medicines the morning of surgery with A SIP OF WATER                        cyclobenzaprine(if needed), omeprazole, tramadol (if needed).    Do not wear jewelry, make-up or nail polish, including gel polish,  artificial nails, or any other type of covering on natural nails (fingers and  toes).  Do not wear lotions, powders, or perfumes, or deodorant.  Do not shave 48 hours prior to surgery.  Men may shave face and neck.  Do not bring valuables to the hospital.  [facility] is not responsible for any belongings or valuables.  Contacts, dentures or bridgework may not be worn into surgery.  Leave your suitcase in the car.  After surgery it may be brought to your room.  For patients admitted to the hospital, discharge time will be determined by your treatment team.  Patients discharged the day of surgery will not be allowed to drive home and must have someone with her for 24 hours.    Please read over the following fact sheets that you were given. Pain Booklet, Coughing and Deep Breathing, Surgical Site Infection Prevention, Anesthesia Post-op Instructions, and Care and  Recovery After Surgery      ORIF Surgery for a Broken Toe: What to Know After After an open reduction with internal fixation (ORIF) surgery for a broken toe, it's common to have pain, swelling, and bruises. ORIF is s a type of surgery that is used to repair a break in a bone and keep the bones stable. Follow these instructions at home: Medicines Take your medicines only as told. You may need to take steps to help treat or prevent trouble pooping (constipation), such as: Taking medicines to help you poop. Eating foods high in fiber, like beans, whole grains, and fresh fruits and vegetables. Drinking more fluids as told. Ask your provider if it's safe to drive or use machines while taking your medicine. If you have a cast or a splint that can't be taken off: Do not put pressure on any part of the cast or splint until it's hard. This may take a few hours. Do not stick anything inside it to scratch your skin. Doing this can lead to infection. Check the skin around your cast or splint every day. Tell your provider if you see problems. It is OK to put lotion on dry skin around the cast or splint.  Keep the cast or splint clean and dry. Bathing Do not take baths, swim, or use a hot tub until you're told it's OK. Ask if you can shower. If your cast or splint isn't waterproof: Do not let it get wet. Cover it when you take a bath or shower. Use a cover that doesn't let any water in. Incision care Keep the bandage on. Your provider will change it during your follow-up visit. When the bandage is no longer needed, check the area around the cut every day for signs of infection. Check for: More redness, swelling, or pain. Fluid or blood. Warmth. Pus or a bad smell. Managing pain, stiffness, and swelling  Use ice or an ice pack as told. Put ice in a plastic bag. Place a towel between your skin and the ice or between your cast and the ice. Leave the ice on for 20 minutes, 2-3 times a day. Remove  the ice if your skin turns bright red. This is very important. If you cannot feel pain, heat, or cold, you have a greater risk of damage to the area. If your skin turns red, take off the ice right away to prevent skin damage. The risk of damage is higher if you can't feel pain, heat, or cold. Move your toes often to reduce stiffness and swelling. Raise your injured foot above the level of your heart while you're sitting or lying down. Use pillows as needed. Activity Ask what things are safe for you to do at home. Ask when you can go back to work or school. Do not stand or walk on your injured foot until you're told it's OK. Use crutches or a walker. Ask your health care provider when it's safe to drive. Exercise as told. You will work with a physical therapist for a few months. General instructions Do not smoke, vape, or use nicotine or tobacco. Keep all follow-up visits. Your provider will check if your toe is healing. Your provider may give you more instructions. Make sure you know what you can and can't do. Contact a health care provider if: You have any signs of infection. You have a fever or chills. Your pain is very bad and medicine is not helping. Your cast or splint feels too tight or too loose. Your cast or splint becomes damaged. Get help right away if: Your skin or toes on the injured foot: Turn blue or gray. Feel cold or very numb. Lose feeling. This information is not intended to replace advice given to you by your health care provider. Make sure you discuss any questions you have with your health care provider. Document Revised: 05/19/2023 Document Reviewed: 05/19/2023 Elsevier Patient Education  2024 [employer]General Anesthesia, Adult, Care After The following information offers guidance on how to care for yourself after your procedure. Your health care provider may also give you more specific instructions. If you have problems or questions, contact your health care  provider. What can I expect after the procedure? After the procedure, it is common for people to: Have pain or discomfort at the IV site. Have nausea or vomiting. Have a sore throat or hoarseness. Have trouble concentrating. Feel cold or chills. Feel weak, sleepy, or tired (fatigue). Have soreness and body aches. These can affect parts of the body that were not involved in surgery. Follow these instructions at home: For the time period you were told by your health care provider:  Rest. Do not participate in activities where you could fall or become injured.  Do not drive or use machinery. Do not drink alcohol. Do not take sleeping pills or medicines that cause drowsiness. Do not make important decisions or sign legal documents. Do not take care of children on your own. General instructions Drink enough fluid to keep your urine pale yellow. If you have sleep apnea, surgery and certain medicines can increase your risk for breathing problems. Follow instructions from your health care provider about wearing your sleep device: Anytime you are sleeping, including during daytime naps. While taking prescription pain medicines, sleeping medicines, or medicines that make you drowsy. Return to your normal activities as told by your health care provider. Ask your health care provider what activities are safe for you. Take over-the-counter and prescription medicines only as told by your health care provider. Do not use any products that contain nicotine or tobacco. These products include cigarettes, chewing tobacco, and vaping devices, such as e-cigarettes. These can delay incision healing after surgery. If you need help quitting, ask your health care provider. Contact a health care provider if: You have nausea or vomiting that does not get better with medicine. You vomit every time you eat or drink. You have pain that does not get better with medicine. You cannot urinate or have bloody urine. You  develop a skin rash. You have a fever. Get help right away if: You have trouble breathing. You have chest pain. You vomit blood. These symptoms may be an emergency. Get help right away. Call 911. Do not wait to see if the symptoms will go away. Do not drive yourself to the hospital. Summary After the procedure, it is common to have a sore throat, hoarseness, nausea, vomiting, or to feel weak, sleepy, or fatigue. For the time period you were told by your health care provider, do not drive or use machinery. Get help right away if you have difficulty breathing, have chest pain, or vomit blood. These symptoms may be an emergency. This information is not intended to replace advice given to you by your health care provider. Make sure you discuss any questions you have with your health care provider. Document Revised: 01/21/2022 Document Reviewed: 01/21/2022 Elsevier Patient Education  2024 [employer]. "

## 2024-11-15 ENCOUNTER — Ambulatory Visit (HOSPITAL_COMMUNITY)
Admission: RE | Admit: 2024-11-15 | Discharge: 2024-11-15 | Disposition: A | Discharge status: Home / Self Care | Attending: Podiatry | Admitting: Podiatry

## 2024-11-15 ENCOUNTER — Encounter (HOSPITAL_COMMUNITY)
Admission: RE | Disposition: A | Discharge status: Home / Self Care | Payer: Self-pay | Source: Home / Self Care | Attending: Podiatry

## 2024-11-15 ENCOUNTER — Ambulatory Visit (HOSPITAL_COMMUNITY)

## 2024-11-15 ENCOUNTER — Ambulatory Visit (HOSPITAL_COMMUNITY): Admitting: Anesthesiology

## 2024-11-15 DIAGNOSIS — S92332K Displaced fracture of third metatarsal bone, left foot, subsequent encounter for fracture with nonunion: Secondary | ICD-10-CM | POA: Diagnosis present

## 2024-11-15 DIAGNOSIS — X58XXXD Exposure to other specified factors, subsequent encounter: Secondary | ICD-10-CM | POA: Insufficient documentation

## 2024-11-15 DIAGNOSIS — S92335K Nondisplaced fracture of third metatarsal bone, left foot, subsequent encounter for fracture with nonunion: Secondary | ICD-10-CM

## 2024-11-15 DIAGNOSIS — K219 Gastro-esophageal reflux disease without esophagitis: Secondary | ICD-10-CM | POA: Insufficient documentation

## 2024-11-15 HISTORY — PX: ORIF TOE FRACTURE: SHX5032

## 2024-11-15 MED ORDER — PROPOFOL 10 MG/ML IV BOLUS
INTRAVENOUS | Status: DC | PRN
  Administered 2024-11-15: 100 mg via INTRAVENOUS

## 2024-11-15 MED ORDER — LACTATED RINGERS IV SOLN: INTRAVENOUS | Status: DC

## 2024-11-15 MED ORDER — BUPIVACAINE HCL (PF) 0.5 % IJ SOLN
INTRAMUSCULAR | Status: DC | PRN
  Administered 2024-11-15: 7 mL
  Administered 2024-11-15: 10 mL

## 2024-11-15 MED ORDER — EPHEDRINE SULFATE (PRESSORS) 25 MG/5ML IV SOSY
PREFILLED_SYRINGE | INTRAVENOUS | Status: DC | PRN
  Administered 2024-11-15: 5 mg via INTRAVENOUS
  Administered 2024-11-15: 10 mg via INTRAVENOUS

## 2024-11-15 MED ORDER — CEFAZOLIN SODIUM-DEXTROSE 2-4 GM/100ML-% IV SOLN: 2.0000 g | Freq: Once | INTRAVENOUS | Status: DC

## 2024-11-15 MED ORDER — FENTANYL CITRATE (PF) 50 MCG/ML IJ SOSY: 25.0000 ug | PREFILLED_SYRINGE | INTRAMUSCULAR | Status: DC | PRN

## 2024-11-15 MED ORDER — CHLORHEXIDINE GLUCONATE CLOTH 2 % EX PADS: 6.0000 | MEDICATED_PAD | Freq: Once | CUTANEOUS | Status: DC

## 2024-11-15 MED ORDER — CEFAZOLIN SODIUM-DEXTROSE 2-4 GM/100ML-% IV SOLN
2.0000 g | INTRAVENOUS | Status: AC
  Administered 2024-11-15: 2 g via INTRAVENOUS

## 2024-11-15 MED ORDER — DEXAMETHASONE SODIUM PHOSPHATE 4 MG/ML IJ SOLN
INTRAMUSCULAR | Status: AC
  Filled 2024-11-15: qty 1

## 2024-11-15 MED ORDER — OXYCODONE HCL 5 MG/5ML PO SOLN: 5.0000 mg | Freq: Once | ORAL | Status: DC | PRN

## 2024-11-15 MED ORDER — CHLORHEXIDINE GLUCONATE 0.12 % MT SOLN
15.0000 mL | Freq: Once | OROMUCOSAL | Status: AC
  Administered 2024-11-15: 15 mL via OROMUCOSAL

## 2024-11-15 MED ORDER — PROPOFOL 10 MG/ML IV BOLUS
INTRAVENOUS | Status: AC
  Filled 2024-11-15: qty 20

## 2024-11-15 MED ORDER — FENTANYL CITRATE (PF) 100 MCG/2ML IJ SOLN
INTRAMUSCULAR | Status: DC | PRN
  Administered 2024-11-15 (×2): 50 ug via INTRAVENOUS

## 2024-11-15 MED ORDER — ONDANSETRON HCL 4 MG/2ML IJ SOLN: 4.0000 mg | Freq: Once | INTRAMUSCULAR | Status: DC | PRN

## 2024-11-15 MED ORDER — BUPIVACAINE HCL (PF) 0.5 % IJ SOLN
INTRAMUSCULAR | Status: AC
  Filled 2024-11-15: qty 30

## 2024-11-15 MED ORDER — ORAL CARE MOUTH RINSE: 15.0000 mL | Freq: Once | OROMUCOSAL | Status: AC

## 2024-11-15 MED ORDER — DEXMEDETOMIDINE HCL IN NACL 80 MCG/20ML IV SOLN
INTRAVENOUS | Status: AC
  Filled 2024-11-15: qty 20

## 2024-11-15 MED ORDER — LIDOCAINE 2% (20 MG/ML) 5 ML SYRINGE
INTRAMUSCULAR | Status: DC | PRN
  Administered 2024-11-15: 60 mg via INTRAVENOUS

## 2024-11-15 MED ORDER — CEFAZOLIN SODIUM-DEXTROSE 2-4 GM/100ML-% IV SOLN
INTRAVENOUS | Status: AC
  Filled 2024-11-15: qty 100

## 2024-11-15 MED ORDER — FENTANYL CITRATE (PF) 100 MCG/2ML IJ SOLN
INTRAMUSCULAR | Status: AC
  Filled 2024-11-15: qty 2

## 2024-11-15 MED ORDER — DEXAMETHASONE SOD PHOSPHATE PF 10 MG/ML IJ SOLN
INTRAMUSCULAR | Status: DC | PRN
  Administered 2024-11-15: 5 mg via INTRAVENOUS

## 2024-11-15 MED ORDER — OXYCODONE HCL 5 MG PO TABS: 5.0000 mg | ORAL_TABLET | Freq: Once | ORAL | Status: DC | PRN

## 2024-11-15 MED ORDER — ONDANSETRON HCL 4 MG/2ML IJ SOLN
INTRAMUSCULAR | Status: DC | PRN
  Administered 2024-11-15: 4 mg via INTRAVENOUS

## 2024-11-15 MED ORDER — 0.9 % SODIUM CHLORIDE (POUR BTL) OPTIME
TOPICAL | Status: DC | PRN
  Administered 2024-11-15: 1000 mL

## 2024-11-15 NOTE — Anesthesia Procedure Notes (Signed)
 Procedure Name: LMA Insertion Date/Time: 11/15/2024 9:02 AM  Performed by: Cordella Elvie HERO, CRNAPre-anesthesia Checklist: Patient identified, Emergency Drugs available, Suction available, Patient being monitored and Timeout performed Patient Re-evaluated:Patient Re-evaluated prior to induction Oxygen Delivery Method: Circle system utilized Preoxygenation: Pre-oxygenation with 100% oxygen Induction Type: IV induction LMA: LMA inserted LMA Size: 4.0 Number of attempts: 1 Placement Confirmation: positive ETCO2, CO2 detector and breath sounds checked- equal and bilateral Tube secured with: Tape Dental Injury: Teeth and Oropharynx as per pre-operative assessment

## 2024-11-15 NOTE — Brief Op Note (Signed)
 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

## 2024-11-15 NOTE — Transfer of Care (Signed)
 Immediate Anesthesia Transfer of Care Note  Patient: Mercedes Schultz  Procedure(s) Performed: OPEN REDUCTION INTERNAL FIXATION (ORIF) METATARSAL (TOE) FRACTURE (Left: Third Toe)  Patient Location: PACU  Anesthesia Type:General  Level of Consciousness: awake, alert , oriented, and patient cooperative  Airway & Oxygen Therapy: Patient Spontanous Breathing  Post-op Assessment: Report given to RN, Post -op Vital signs reviewed and stable, and Patient moving all extremities X 4  Post vital signs: Reviewed and stable  Last Vitals:  Vitals Value Taken Time  BP 124/65 11/15/24 10:25  Temp 36.7 C 11/15/24 10:22  Pulse 85 11/15/24 10:26  Resp 11 11/15/24 10:26  SpO2 94 % 11/15/24 10:26  Vitals shown include unfiled device data.  Last Pain:  Vitals:   11/15/24 1022  TempSrc:   PainSc: Asleep      Patients Stated Pain Goal: 7 (11/15/24 0820)  Complications: No notable events documented.

## 2024-11-15 NOTE — Anesthesia Preprocedure Evaluation (Signed)
"                                    Anesthesia Evaluation  Patient identified by MRN, date of birth, ID band Patient awake    Reviewed: Allergy & Precautions, H&P , NPO status , Patient's Chart, lab work & pertinent test results, reviewed documented beta blocker date and time   Airway Mallampati: II  TM Distance: >3 FB Neck ROM: full    Dental no notable dental hx.    Pulmonary neg pulmonary ROS   Pulmonary exam normal breath sounds clear to auscultation       Cardiovascular Exercise Tolerance: Good hypertension, negative cardio ROS  Rhythm:regular Rate:Normal     Neuro/Psych negative neurological ROS  negative psych ROS   GI/Hepatic Neg liver ROS, hiatal hernia,GERD  ,,  Endo/Other  negative endocrine ROS    Renal/GU negative Renal ROS  negative genitourinary   Musculoskeletal   Abdominal   Peds  Hematology negative hematology ROS (+)   Anesthesia Other Findings   Reproductive/Obstetrics negative OB ROS                              Anesthesia Physical Anesthesia Plan  ASA: 2  Anesthesia Plan: MAC   Post-op Pain Management:    Induction:   PONV Risk Score and Plan: Propofol  infusion  Airway Management Planned:   Additional Equipment:   Intra-op Plan:   Post-operative Plan:   Informed Consent: I have reviewed the patients History and Physical, chart, labs and discussed the procedure including the risks, benefits and alternatives for the proposed anesthesia with the patient or authorized representative who has indicated his/her understanding and acceptance.     Dental Advisory Given  Plan Discussed with: CRNA  Anesthesia Plan Comments:         Anesthesia Quick Evaluation  "

## 2024-11-15 NOTE — Discharge Instructions (Signed)
 These instructions will give you an idea of what to expect after surgery and how to manage issues that may arise before your first post op office visit.  Pain Management Pain is best managed by staying ahead of it. If pain gets out of control, it is difficult to get it back under control. Local anesthesia that lasts 6-8 hours is used to numb the foot and decrease pain.  For the best pain control, take the pain medication every 6 hours for the first 2 days post op. On the third day pain medication can be taken as needed.   Post Op Nausea Nausea is common after surgery, so it is managed proactively.  If prescribed, use the prescribed nausea medication regularly for the first 2 days post op.  Bandages Do not worry if there is blood on the bandage. What looks like a lot of blood on the bandage is actually a small amount. Blood on the dressing spreads out as it is absorbed by the gauze, the same way a drop of water spreads out on a paper towel.  If the bandages feel wet or dry, stiff and uncomfortable, call the office during office hours and we will schedule a time for you to have the bandage changed.  Unless you are specifically told otherwise, we will do the first bandage change in the office.  Keep your bandage dry. If the bandage becomes wet or soiled, notify the office and we will schedule a time to change the bandage.  Activity It is best to spend most of the first 2 days after surgery lying down with the foot elevated above the level of your heart. You may put weight on your heel while wearing the CAM Walker (black boot).   You should leave the CAM Walker on at all times. You may only get up to go to the restroom.  Driving Do not drive until you are able to respond in an emergency (i.e. slam on the brakes). This usually occurs after the bone has healed - 6 to 8 weeks.  Call the Office If you have a fever over 101F.  If you have increasing pain after the initial post op pain has settled  down.  If you have increasing redness, swelling, or drainage.  If you have any questions or concerns.

## 2024-11-15 NOTE — Op Note (Signed)
 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  A pneumatic ankle tourniquet was applied to the operative extremity.  Following sedation, the surgical site was anesthetized with 0.5% Marcaine  plain.  The foot was then prepped, scrubbed, and draped in the usual sterile technique.  The foot was elevated, exsanguinated and the pneumatic ankle tourniquet inflated to 250 mmHg.    ***   The patient tolerated the procedure well.  The patient was then transferred to PACU with vital signs stable and vascular status intact to all toes of the operative foot.

## 2024-11-15 NOTE — H&P (Signed)
 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  The various methods of treatment have been discussed with the patient.  No guarantees were given.  After consideration of risks, benefits and other options for treatment, the patient has consented to surgery.  I have reviewed the patients chart and labs.    Patient Vitals for the past 24 hrs:  BP Temp Temp src Pulse Resp SpO2 Height Weight  11/15/24 0820 (!) 138/99 97.9 F (36.6 C) Oral 76 18 100 % 5' 1 (1.549 m) 63.5 kg    A history and physical examination was performed in my office.  The patient was reexamined.  There have been no changes to this history and physical examination.  Morene Donley Anon, DPM

## 2024-11-15 NOTE — Anesthesia Postprocedure Evaluation (Signed)
"   Anesthesia Post Note  Patient: Mercedes Schultz  Procedure(s) Performed: OPEN REDUCTION INTERNAL FIXATION (ORIF) METATARSAL (TOE) FRACTURE (Left: Third Toe)  Patient location during evaluation: Phase II Anesthesia Type: MAC Level of consciousness: awake Pain management: pain level controlled Vital Signs Assessment: post-procedure vital signs reviewed and stable Respiratory status: spontaneous breathing and respiratory function stable Cardiovascular status: blood pressure returned to baseline and stable Postop Assessment: no headache and no apparent nausea or vomiting Anesthetic complications: no Comments: Late entry   No notable events documented.   Last Vitals:  Vitals:   11/15/24 1022 11/15/24 1117  BP: 116/75 127/70  Pulse: 90 84  Resp:  18  Temp: 36.7 C 36.5 C  SpO2: 95% 96%    Last Pain:  Vitals:   11/15/24 1117  TempSrc: Oral  PainSc: 0-No pain                 Yvonna PARAS Tavares Levinson      "

## 2024-11-26 ENCOUNTER — Encounter (HOSPITAL_COMMUNITY): Payer: Self-pay | Admitting: Podiatry
